# Patient Record
Sex: Female | Born: 2000 | Race: Black or African American | Hispanic: Yes | Marital: Single | Smoking: Never smoker
Health system: Southern US, Community
[De-identification: ages and names within clinical notes are randomized; demographics above are authoritative.]

---
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
## 2018-04-30 ENCOUNTER — Ambulatory Visit (INDEPENDENT_AMBULATORY_CARE_PROVIDER_SITE_OTHER): Payer: Medicaid Other

## 2018-04-30 ENCOUNTER — Ambulatory Visit (HOSPITAL_COMMUNITY)
Admission: EM | Admit: 2018-04-30 | Discharge: 2018-04-30 | Disposition: A | Discharge status: Home / Self Care | Payer: Medicaid Other | Attending: Family Medicine | Admitting: Family Medicine

## 2018-04-30 ENCOUNTER — Encounter (HOSPITAL_COMMUNITY): Payer: Self-pay | Admitting: *Deleted

## 2018-04-30 DIAGNOSIS — S60417A Abrasion of left little finger, initial encounter: Secondary | ICD-10-CM | POA: Diagnosis not present

## 2018-04-30 DIAGNOSIS — T148XXA Other injury of unspecified body region, initial encounter: Secondary | ICD-10-CM

## 2018-04-30 DIAGNOSIS — M791 Myalgia, unspecified site: Secondary | ICD-10-CM

## 2018-04-30 DIAGNOSIS — M25511 Pain in right shoulder: Secondary | ICD-10-CM

## 2018-04-30 DIAGNOSIS — W19XXXA Unspecified fall, initial encounter: Secondary | ICD-10-CM | POA: Diagnosis not present

## 2018-04-30 DIAGNOSIS — M79601 Pain in right arm: Secondary | ICD-10-CM

## 2018-04-30 MED ORDER — MELOXICAM 7.5 MG PO TABS: 7.5000 mg | ORAL_TABLET | Freq: Every day | ORAL | 0 refills | Status: AC

## 2018-04-30 MED ORDER — MUPIROCIN 2 % EX OINT: 1.0000 "application " | TOPICAL_OINTMENT | Freq: Two times a day (BID) | CUTANEOUS | 0 refills | Status: AC
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
## 2018-04-30 NOTE — Discharge Instructions (Signed)
X-ray negative for fracture or dislocation.  Start Mobic as directed.  Ice compress to the joints to help with pain. Bactroban ointment on abrasions.  Keep wound clean and dry, you can use soap and water to clean gently.  Daily dressings.  Do not use hydrogen peroxide or alcohol on open wound.  Monitor for spreading redness, increased warmth, fever, follow-up for reevaluation.  Otherwise follow-up with PCP for further evaluation if symptoms not improving.

## 2020-01-28 DIAGNOSIS — H1013 Acute atopic conjunctivitis, bilateral: Secondary | ICD-10-CM | POA: Diagnosis not present

## 2020-02-24 DIAGNOSIS — H04123 Dry eye syndrome of bilateral lacrimal glands: Secondary | ICD-10-CM | POA: Diagnosis not present

## 2020-02-29 DIAGNOSIS — Z419 Encounter for procedure for purposes other than remedying health state, unspecified: Secondary | ICD-10-CM | POA: Diagnosis not present

## 2020-03-31 DIAGNOSIS — Z419 Encounter for procedure for purposes other than remedying health state, unspecified: Secondary | ICD-10-CM | POA: Diagnosis not present

## 2020-05-01 DIAGNOSIS — Z419 Encounter for procedure for purposes other than remedying health state, unspecified: Secondary | ICD-10-CM | POA: Diagnosis not present
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
## 2020-05-31 DIAGNOSIS — Z419 Encounter for procedure for purposes other than remedying health state, unspecified: Secondary | ICD-10-CM | POA: Diagnosis not present

## 2020-07-01 DIAGNOSIS — Z419 Encounter for procedure for purposes other than remedying health state, unspecified: Secondary | ICD-10-CM | POA: Diagnosis not present

## 2020-07-31 DIAGNOSIS — Z419 Encounter for procedure for purposes other than remedying health state, unspecified: Secondary | ICD-10-CM | POA: Diagnosis not present

## 2020-08-31 DIAGNOSIS — Z419 Encounter for procedure for purposes other than remedying health state, unspecified: Secondary | ICD-10-CM | POA: Diagnosis not present

## 2020-09-24 DIAGNOSIS — Z124 Encounter for screening for malignant neoplasm of cervix: Secondary | ICD-10-CM | POA: Diagnosis not present

## 2020-09-24 DIAGNOSIS — Z113 Encounter for screening for infections with a predominantly sexual mode of transmission: Secondary | ICD-10-CM | POA: Diagnosis not present

## 2020-09-24 DIAGNOSIS — Z0000 Encounter for general adult medical examination without abnormal findings: Secondary | ICD-10-CM | POA: Diagnosis not present

## 2020-10-01 DIAGNOSIS — Z419 Encounter for procedure for purposes other than remedying health state, unspecified: Secondary | ICD-10-CM | POA: Diagnosis not present

## 2020-10-29 DIAGNOSIS — Z419 Encounter for procedure for purposes other than remedying health state, unspecified: Secondary | ICD-10-CM | POA: Diagnosis not present

## 2020-11-29 DIAGNOSIS — Z419 Encounter for procedure for purposes other than remedying health state, unspecified: Secondary | ICD-10-CM | POA: Diagnosis not present

## 2020-12-29 DIAGNOSIS — Z419 Encounter for procedure for purposes other than remedying health state, unspecified: Secondary | ICD-10-CM | POA: Diagnosis not present

## 2021-01-29 DIAGNOSIS — Z419 Encounter for procedure for purposes other than remedying health state, unspecified: Secondary | ICD-10-CM | POA: Diagnosis not present

## 2021-02-28 DIAGNOSIS — Z419 Encounter for procedure for purposes other than remedying health state, unspecified: Secondary | ICD-10-CM | POA: Diagnosis not present

## 2021-12-22 ENCOUNTER — Ambulatory Visit: Payer: Medicaid Other | Admitting: Nurse Practitioner

## 2021-12-24 ENCOUNTER — Ambulatory Visit: Payer: Medicaid Other | Admitting: Nurse Practitioner

## 2023-05-21 ENCOUNTER — Other Ambulatory Visit (HOSPITAL_COMMUNITY)
Admission: RE | Admit: 2023-05-21 | Discharge: 2023-05-21 | Disposition: A | Discharge status: Home / Self Care | Payer: Medicaid Other | Source: Ambulatory Visit | Attending: Nurse Practitioner | Admitting: Nurse Practitioner

## 2023-05-21 ENCOUNTER — Other Ambulatory Visit: Payer: Self-pay | Admitting: Nurse Practitioner

## 2023-05-21 DIAGNOSIS — Z124 Encounter for screening for malignant neoplasm of cervix: Secondary | ICD-10-CM | POA: Diagnosis present

## 2023-05-26 LAB — CYTOLOGY - PAP
Comment: NEGATIVE
Diagnosis: NEGATIVE
High risk HPV: NEGATIVE

## 2023-09-09 ENCOUNTER — Other Ambulatory Visit (HOSPITAL_BASED_OUTPATIENT_CLINIC_OR_DEPARTMENT_OTHER): Payer: Self-pay

## 2023-09-09 ENCOUNTER — Encounter (HOSPITAL_BASED_OUTPATIENT_CLINIC_OR_DEPARTMENT_OTHER): Payer: Self-pay | Admitting: Emergency Medicine

## 2023-09-09 ENCOUNTER — Other Ambulatory Visit: Payer: Self-pay

## 2023-09-09 ENCOUNTER — Emergency Department (HOSPITAL_BASED_OUTPATIENT_CLINIC_OR_DEPARTMENT_OTHER)
Admission: EM | Admit: 2023-09-09 | Discharge: 2023-09-09 | Disposition: A | Discharge status: Home / Self Care | Payer: Medicaid Other | Attending: Emergency Medicine | Admitting: Emergency Medicine

## 2023-09-09 DIAGNOSIS — E876 Hypokalemia: Secondary | ICD-10-CM | POA: Diagnosis not present

## 2023-09-09 DIAGNOSIS — R1013 Epigastric pain: Secondary | ICD-10-CM

## 2023-09-09 DIAGNOSIS — R1010 Upper abdominal pain, unspecified: Secondary | ICD-10-CM | POA: Diagnosis present

## 2023-09-09 LAB — URINALYSIS, ROUTINE W REFLEX MICROSCOPIC
Bilirubin Urine: NEGATIVE
Glucose, UA: NEGATIVE mg/dL
Hgb urine dipstick: NEGATIVE
Ketones, ur: NEGATIVE mg/dL
Leukocytes,Ua: NEGATIVE
Nitrite: NEGATIVE
Protein, ur: NEGATIVE mg/dL
Specific Gravity, Urine: 1.02 (ref 1.005–1.030)
pH: 8 (ref 5.0–8.0)

## 2023-09-09 LAB — CBC
HCT: 40.5 % (ref 36.0–46.0)
Hemoglobin: 13.7 g/dL (ref 12.0–15.0)
MCH: 30.3 pg (ref 26.0–34.0)
MCHC: 33.8 g/dL (ref 30.0–36.0)
MCV: 89.6 fL (ref 80.0–100.0)
Platelets: 342 10*3/uL (ref 150–400)
RBC: 4.52 MIL/uL (ref 3.87–5.11)
RDW: 12.8 % (ref 11.5–15.5)
WBC: 7.5 10*3/uL (ref 4.0–10.5)
nRBC: 0 % (ref 0.0–0.2)

## 2023-09-09 LAB — COMPREHENSIVE METABOLIC PANEL
ALT: 34 U/L (ref 0–44)
AST: 22 U/L (ref 15–41)
Albumin: 4.8 g/dL (ref 3.5–5.0)
Alkaline Phosphatase: 52 U/L (ref 38–126)
Anion gap: 9 (ref 5–15)
BUN: 7 mg/dL (ref 6–20)
CO2: 24 mmol/L (ref 22–32)
Calcium: 9.3 mg/dL (ref 8.9–10.3)
Chloride: 105 mmol/L (ref 98–111)
Creatinine, Ser: 0.59 mg/dL (ref 0.44–1.00)
GFR, Estimated: >60 mL/min (ref >60)
Glucose, Bld: 93 mg/dL (ref 70–99)
Potassium: 3.4 mmol/L — ABNORMAL LOW (ref 3.5–5.1)
Sodium: 138 mmol/L (ref 135–145)
Total Bilirubin: 0.5 mg/dL (ref 0.0–1.2)
Total Protein: 7.8 g/dL (ref 6.5–8.1)

## 2023-09-09 LAB — PREGNANCY, URINE: Preg Test, Ur: NEGATIVE

## 2023-09-09 LAB — LIPASE, BLOOD: Lipase: 24 U/L (ref 11–51)

## 2023-09-09 MED ORDER — FAMOTIDINE 20 MG PO TABS
20.0000 mg | ORAL_TABLET | Freq: Every day | ORAL | 0 refills | Status: AC | PRN
  Filled 2023-09-09: qty 30, 30d supply, fill #0

## 2023-09-09 MED ORDER — ALUM & MAG HYDROXIDE-SIMETH 200-200-20 MG/5ML PO SUSP
15.0000 mL | Freq: Once | ORAL | Status: AC
  Administered 2023-09-09: 15 mL via ORAL
  Filled 2023-09-09: qty 30

## 2023-09-09 MED ORDER — FAMOTIDINE 20 MG PO TABS
20.0000 mg | ORAL_TABLET | Freq: Once | ORAL | Status: AC
  Administered 2023-09-09: 20 mg via ORAL
  Filled 2023-09-09: qty 1

## 2023-09-09 NOTE — ED Triage Notes (Signed)
 Pt c/o Abd pain today, similar symptoms last month. Denies n/v/d, denies urinary issues

## 2023-09-09 NOTE — ED Provider Notes (Signed)
  EMERGENCY DEPARTMENT AT [facility] Provider Note   CSN: [id number] Arrival date & time: 09/09/23  1135     History  Chief Complaint  Patient presents with   Abdominal Pain    Kelsey Rios is a 23 y.o. female.   Abdominal Pain   23 year old female presents emergency department complaints of abdominal pain.  Patient reports upper abdominal abdominal pain with some radiation to her back.  States that symptoms began this morning when she was drinking coffee.  Reports similar symptoms last month that lasted for couple hours before resolving spontaneously.  Denies any fever, chills, nausea, vomiting, urinary symptoms, change in bowel habits.  Has tried no medication for her symptoms.  No significant pertinent past medical history.  Home Medications Prior to Admission medications   Medication Sig Start Date End Date Taking? Authorizing Provider  famotidine  (PEPCID ) 20 MG tablet Take 1 tablet (20 mg total) by mouth daily as needed for heartburn or indigestion. 09/09/23  Yes Silver Fell A, PA  IRON PO Take by mouth.    [provider]  meloxicam  (MOBIC ) 7.5 MG tablet Take 1 tablet (7.5 mg total) by mouth daily. 04/30/18   Babara, Amy V, PA-C  mupirocin  ointment (BACTROBAN ) 2 % Apply 1 application topically 2 (two) times daily. 04/30/18   Babara Greig GAILS, PA-C      Allergies    Patient has no known allergies.    Review of Systems   Review of Systems  Gastrointestinal:  Positive for abdominal pain.  All other systems reviewed and are negative.   Physical Exam Updated Vital Signs BP (!) 139/99   Pulse 67   Temp 98.4 F (36.9 C)   Resp 16   SpO2 100%  Physical Exam Vitals and nursing note reviewed.  Constitutional:      General: She is not in acute distress.    Appearance: She is well-developed.  HENT:     Head: Normocephalic and atraumatic.  Eyes:     Conjunctiva/sclera: Conjunctivae normal.  Cardiovascular:     Rate and Rhythm: Normal rate and  regular rhythm.     Heart sounds: No murmur heard. Pulmonary:     Effort: Pulmonary effort is normal. No respiratory distress.     Breath sounds: Normal breath sounds.  Abdominal:     Palpations: Abdomen is soft.     Tenderness: There is abdominal tenderness in the epigastric area. There is no right CVA tenderness or left CVA tenderness. Negative signs include Murphy's sign and McBurney's sign.  Musculoskeletal:        General: No swelling.     Cervical back: Neck supple.  Skin:    General: Skin is warm and dry.     Capillary Refill: Capillary refill takes less than 2 seconds.  Neurological:     Mental Status: She is alert.  Psychiatric:        Mood and Affect: Mood normal.     ED Results / Procedures / Treatments   Labs (all labs ordered are listed, but only abnormal results are displayed) Labs Reviewed  COMPREHENSIVE METABOLIC PANEL - Abnormal; Notable for the following components:      Result Value   Potassium 3.4 (*)    All other components within normal limits  LIPASE, BLOOD  CBC  URINALYSIS, ROUTINE W REFLEX MICROSCOPIC  PREGNANCY, URINE    EKG None  Radiology No results found.  Procedures Procedures    Medications Ordered in ED Medications  alum &  mag hydroxide-simeth (MAALOX/MYLANTA) 200-200-20 MG/5ML suspension 15 mL (15 mLs Oral Given 09/09/23 1305)  famotidine  (PEPCID ) tablet 20 mg (20 mg Oral Given 09/09/23 1306)    ED Course/ Medical Decision Making/ A&P                                 Medical Decision Making Amount and/or Complexity of Data Reviewed Labs: ordered.  Risk OTC drugs.   This patient presents to the ED for concern of abdominal pain, this involves an extensive number of treatment options, and is a complaint that carries with it a high risk of complications and morbidity.  The differential diagnosis includes gastritis, PUD, pancreatitis, cholecystitis, CBD pathology, diverticulitis, appendicitis, nephrolithiasis, pyelonephritis,  cystitis, SBO/LBO, volvulus, intrauterine pregnancy, tubo-ovarian abscess, ectopic pregnancy, ovarian torsion, other   Co morbidities that complicate the patient evaluation  See HPI   Additional history obtained:  Additional history obtained from EMR External records from outside source obtained and reviewed including hospital records   Lab Tests:  I Ordered, and personally interpreted labs.  The pertinent results include: No leukocytosis.  No evidence of anemia.  Platelets within range.  Mild hypokalemia 3.4 otherwise, electrolytes within limits.  No transaminitis.  No renal dysfunction.  Lipase within normal limits.  Urine pregnancy negative.  UA without abnormality.   Imaging Studies ordered:  N/a   Cardiac Monitoring: / EKG:  The patient was maintained on a cardiac monitor.  I personally viewed and interpreted the cardiac monitored which showed an underlying rhythm of:  sinus rhythm   Consultations Obtained:  N/a   Problem List / ED Course / Critical interventions / Medication management  Epigastric abdominal pain I ordered medication including Pepcid , Maalox   Reevaluation of the patient after these medicines showed that the patient improved I have reviewed the patients home medicines and have made adjustments as needed   Social Determinants of Health:  Denies tobacco, illicit drug use   Test / Admission - Considered:  Epigastric abdominal pain Vitals signs significant for slight hypertension blood pressure 139 systolic. Otherwise within normal range and stable throughout visit. Laboratory/imaging studies significant for: See above 23 year old female presents emergency department with complaints of upper middle abdominal pain that began after drinking coffee.  Reports history of similar symptoms in the past that was somewhat fleeting in nature.  On exam, tenderness epigastrium.  Labs reassuring besides slight hypokalemia 3.4.  Treated with GI cocktail and  noted significant improvement of symptoms.  Suspect patient's symptoms likely secondary to gastritis versus GERD versus PUD.  Imaging not indicated clinically.  Will recommend lifestyle modifications and as needed Pepcid  and follow-up with primary care for reassessment.  Treatment plan discussed at length with patient and she acknowledged understanding was agreeable to said plan.  Patient overall well-appearing, afebrile in no acute distress. Worrisome signs and symptoms were discussed with the patient, and the patient acknowledged understanding to return to the ED if noticed. Patient was stable upon discharge.          Final Clinical Impression(s) / ED Diagnoses Final diagnoses:  Epigastric abdominal pain    Rx / DC Orders ED Discharge Orders          Ordered    famotidine  (PEPCID ) 20 MG tablet  Daily PRN        09/09/23 1330              Silver Wonda LABOR, [place] 09/09/23 1735  Kingsley, Victoria K, DO 09/11/23 [id number]

## 2023-09-09 NOTE — ED Notes (Signed)

## 2023-09-09 NOTE — Discharge Instructions (Addendum)
 As discussed, workup today overall reassuring.  Your labs appeared normal.  Given where you are tender along with normal-appearing labs, suspect that symptoms are likely coming from the stomach.  Suspect you have GERD versus gastritis versus peptic ulcer disease.  Will treat with reflux medications as well as recommend lifestyle changes as described in instruction information packet.  Recommend follow-up with your primary care for reassessment.  Please do not hesitate to return if the worrisome signs and symptoms we discussed become apparent.

## 2024-05-09 ENCOUNTER — Ambulatory Visit
Admission: RE | Admit: 2024-05-09 | Discharge: 2024-05-09 | Disposition: A | Discharge status: Home / Self Care | Source: Ambulatory Visit | Attending: Emergency Medicine | Admitting: Emergency Medicine

## 2024-05-09 VITALS — BP 125/80 | HR 68 | Temp 98.3°F | Resp 18

## 2024-05-09 DIAGNOSIS — U071 COVID-19: Secondary | ICD-10-CM | POA: Diagnosis not present

## 2024-05-09 LAB — POC SOFIA SARS ANTIGEN FIA: SARS Coronavirus 2 Ag: POSITIVE — AB

## 2024-05-09 NOTE — ED Triage Notes (Signed)
 Pt c/o runny nose, headache, ears pain, body aches, upset stomach, hot and cold flashes. Symptoms began Saturday but got much worse yesterday.

## 2024-05-09 NOTE — ED Provider Notes (Signed)
 [facility]    CSN: [id number] Arrival date & time: 05/09/24  0935      History   Chief Complaint Chief Complaint  Patient presents with   Nasal Congestion    Not sure if I'm having a sinus infection but my nose is runny, I have a mild headache, my ears are popping, I'm having body aches, upset stomach and I've been having hot and cold flashes. - Entered by patient    HPI Kelsey Rios is a 23 y.o. female.   23 year old female, Kelsey Rios, presents to urgent care for evaluation of runny nose, headache, ear pain, body aches, upset stomach, hot and cold flashes x 3 days.  Patient is using over-the-counter meds for symptom management, works at [employer], unknown illness exposure.  The history is provided by the patient. No language interpreter was used.    History reviewed. No pertinent past medical history.  Patient Active Problem List   Diagnosis Date Noted   COVID 05/09/2024   Well child visit 12/17/2011    History reviewed. No pertinent surgical history.  OB History   No obstetric history on file.      Home Medications    Prior to Admission medications   Medication Sig Start Date End Date Taking? Authorizing Provider  famotidine  (PEPCID ) 20 MG tablet Take 1 tablet (20 mg total) by mouth daily as needed for heartburn or indigestion. 09/09/23   Silver Fell A, PA  IRON PO Take by mouth.    [provider]  meloxicam  (MOBIC ) 7.5 MG tablet Take 1 tablet (7.5 mg total) by mouth daily. 04/30/18   Babara, Amy V, PA-C  mupirocin  ointment (BACTROBAN ) 2 % Apply 1 application topically 2 (two) times daily. 04/30/18   Babara Greig GAILS, PA-C    Family History Family History  Problem Relation Age of Onset   Diabetes Mother    Anemia Mother    Diabetes Maternal Grandmother    Diabetes Maternal Grandfather     Social History Social History   Tobacco Use   Smoking status: Never   Smokeless tobacco: Never  Vaping Use   Vaping status: Never Used   Substance Use Topics   Alcohol use: Yes    Comment: occ   Drug use: Not Currently    Types: Marijuana     Allergies   Patient has no known allergies.   Review of Systems Review of Systems  Constitutional:  Positive for chills.  HENT:  Positive for ear pain and rhinorrhea.   Musculoskeletal:  Positive for myalgias.  Neurological:  Positive for headaches.  All other systems reviewed and are negative.    Physical Exam Triage Vital Signs ED Triage Vitals  Encounter Vitals Group     BP 05/09/24 0943 125/80     Girls Systolic BP Percentile --      Girls Diastolic BP Percentile --      Boys Systolic BP Percentile --      Boys Diastolic BP Percentile --      Pulse Rate 05/09/24 0943 68     Resp 05/09/24 0943 18     Temp 05/09/24 0943 98.3 F (36.8 C)     Temp Source 05/09/24 0943 Oral     SpO2 05/09/24 0943 98 %     Weight --      Height --      Head Circumference --      Peak Flow --      Pain Score  05/09/24 0944 3     Pain Loc --      Pain Education --      Exclude from Growth Chart --    No data found.  Updated Vital Signs BP 125/80 (BP Location: Right Arm)   Pulse 68   Temp 98.3 F (36.8 C) (Oral)   Resp 18   LMP 05/01/2024 (Exact Date)   SpO2 98%   Visual Acuity Right Eye Distance:   Left Eye Distance:   Bilateral Distance:    Right Eye Near:   Left Eye Near:    Bilateral Near:     Physical Exam Vitals and nursing note reviewed.  Constitutional:      General: She is not in acute distress.    Appearance: She is well-developed.  HENT:     Head: Normocephalic.     Right Ear: Tympanic membrane is retracted.     Left Ear: Tympanic membrane is retracted.     Nose: Congestion present.     Mouth/Throat:     Lips: Pink.     Mouth: Mucous membranes are moist.     Pharynx: Oropharynx is clear. Uvula midline.     Tonsils: No tonsillar exudate or tonsillar abscesses.  Eyes:     General: Lids are normal.     Conjunctiva/sclera: Conjunctivae normal.      Pupils: Pupils are equal, round, and reactive to light.  Neck:     Trachea: No tracheal deviation.  Cardiovascular:     Rate and Rhythm: Normal rate and regular rhythm.     Heart sounds: Normal heart sounds. No murmur heard. Pulmonary:     Effort: Pulmonary effort is normal.     Breath sounds: Normal breath sounds and air entry.  Abdominal:     General: Bowel sounds are normal.     Palpations: Abdomen is soft.     Tenderness: There is no abdominal tenderness.  Musculoskeletal:        General: Normal range of motion.     Cervical back: Normal range of motion.  Lymphadenopathy:     Cervical: No cervical adenopathy.  Skin:    General: Skin is warm and dry.     Findings: No rash.  Neurological:     General: No focal deficit present.     Mental Status: She is alert and oriented to person, place, and time.     GCS: GCS eye subscore is 4. GCS verbal subscore is 5. GCS motor subscore is 6.  Psychiatric:        Attention and Perception: Attention normal.        Mood and Affect: Mood normal.        Speech: Speech normal.        Behavior: Behavior normal. Behavior is cooperative.      UC Treatments / Results  Labs (all labs ordered are listed, but only abnormal results are displayed) Labs Reviewed  POC SOFIA SARS ANTIGEN FIA - Abnormal; Notable for the following components:      Result Value   SARS Coronavirus 2 Ag Positive (*)    All other components within normal limits    EKG   Radiology No results found.  Procedures Procedures (including critical care time)  Medications Ordered in UC Medications - No data to display  Initial Impression / Assessment and Plan / UC Course  I have reviewed the triage vital signs and the nursing notes.  Pertinent labs & imaging results that were available during my care of  the patient were reviewed by me and considered in my medical decision making (see chart for details).    Discussed exam findings and plan of care with  patient, strict go to ER precautions given.   Patient verbalized understanding to this provider.  Ddx: COVID, viral illness, allergies Final Clinical Impressions(s) / UC Diagnoses   Final diagnoses:  COVID     Discharge Instructions      You have tested positive for covid.  This is a viral illness and will need to run its course. May use over the counter meds for symptom management as label directed(tylenol,ibuprofen,chloraseptic, mucionex,etc).Push fluids. If you develop chest pain,palpitations, shortness of breath,etc. Go to er for further evaluation.     ED Prescriptions   None    PDMP not reviewed this encounter.   Aminta Loose, NP 05/09/24 1028

## 2024-05-09 NOTE — Discharge Instructions (Signed)
 You have tested positive for covid.  This is a viral illness and will need to run its course. May use over the counter meds for symptom management as label directed(tylenol,ibuprofen,chloraseptic, mucionex,etc).Push fluids. If you develop chest pain,palpitations, shortness of breath,etc. Go to er for further evaluation.
